# Patient Record
Sex: Male | Born: 1990 | Race: Black or African American | Hispanic: No | Marital: Single | Smoking: Current every day smoker
Health system: Southern US, Community
[De-identification: ages and names within clinical notes are randomized; demographics above are authoritative.]

---

## 2013-08-05 ENCOUNTER — Emergency Department: Payer: Self-pay | Admitting: Emergency Medicine

## 2013-09-02 ENCOUNTER — Emergency Department: Payer: Self-pay | Admitting: Emergency Medicine

## 2014-12-17 ENCOUNTER — Emergency Department
Admit: 2014-12-17 | Disposition: A | Discharge status: Home / Self Care | Payer: Self-pay | Admitting: Emergency Medicine

## 2015-01-11 ENCOUNTER — Emergency Department
Admit: 2015-01-11 | Disposition: A | Discharge status: Home / Self Care | Payer: Self-pay | Admitting: Emergency Medicine

## 2015-04-02 ENCOUNTER — Ambulatory Visit: Payer: Medicaid Other | Admitting: Anesthesiology

## 2015-04-02 ENCOUNTER — Encounter: Payer: Self-pay | Admitting: *Deleted

## 2015-04-02 ENCOUNTER — Ambulatory Visit
Admission: RE | Admit: 2015-04-02 | Discharge: 2015-04-02 | Disposition: A | Discharge status: Home / Self Care | Payer: Medicaid Other | Source: Ambulatory Visit | Attending: Surgery | Admitting: Surgery

## 2015-04-02 ENCOUNTER — Encounter
Admission: RE | Disposition: A | Discharge status: Home / Self Care | Payer: Self-pay | Source: Ambulatory Visit | Attending: Surgery

## 2015-04-02 DIAGNOSIS — S62021K Displaced fracture of middle third of navicular [scaphoid] bone of right wrist, subsequent encounter for fracture with nonunion: Secondary | ICD-10-CM | POA: Diagnosis not present

## 2015-04-02 DIAGNOSIS — W19XXXD Unspecified fall, subsequent encounter: Secondary | ICD-10-CM | POA: Diagnosis not present

## 2015-04-02 DIAGNOSIS — F17200 Nicotine dependence, unspecified, uncomplicated: Secondary | ICD-10-CM | POA: Insufficient documentation

## 2015-04-02 DIAGNOSIS — S62001K Unspecified fracture of navicular [scaphoid] bone of right wrist, subsequent encounter for fracture with nonunion: Secondary | ICD-10-CM | POA: Diagnosis present

## 2015-04-02 HISTORY — PX: ORIF SCAPHOID FRACTURE: SHX2130

## 2015-04-02 SURGERY — OPEN REDUCTION INTERNAL FIXATION (ORIF) SCAPHOID FRACTURE
Anesthesia: General | Laterality: Right | Wound class: Clean

## 2015-04-02 MED ORDER — LACTATED RINGERS IV SOLN
INTRAVENOUS | Status: DC
  Administered 2015-04-02: 13:00:00 via INTRAVENOUS

## 2015-04-02 MED ORDER — OXYCODONE HCL 5 MG PO TABS: 5.0000 mg | ORAL_TABLET | ORAL | Status: DC | PRN

## 2015-04-02 MED ORDER — METOCLOPRAMIDE HCL 5 MG/ML IJ SOLN
5.0000 mg | Freq: Three times a day (TID) | INTRAMUSCULAR | Status: DC | PRN
Start: 2015-04-02 — End: 2015-04-02

## 2015-04-02 MED ORDER — POTASSIUM CHLORIDE IN NACL 20-0.9 MEQ/L-% IV SOLN: INTRAVENOUS | Status: DC

## 2015-04-02 MED ORDER — FENTANYL CITRATE (PF) 100 MCG/2ML IJ SOLN
INTRAMUSCULAR | Status: DC | PRN
  Administered 2015-04-02: 50 ug via INTRAVENOUS
  Administered 2015-04-02 (×3): 25 ug via INTRAVENOUS
  Administered 2015-04-02: 50 ug via INTRAVENOUS
  Administered 2015-04-02: 25 ug via INTRAVENOUS

## 2015-04-02 MED ORDER — OXYCODONE HCL 5 MG PO TABS
5.0000 mg | ORAL_TABLET | ORAL | Status: DC | PRN
  Administered 2015-04-02: 10 mg via ORAL

## 2015-04-02 MED ORDER — LIDOCAINE HCL (CARDIAC) 20 MG/ML IV SOLN
INTRAVENOUS | Status: DC | PRN
  Administered 2015-04-02: 100 mg via INTRAVENOUS

## 2015-04-02 MED ORDER — FENTANYL CITRATE (PF) 100 MCG/2ML IJ SOLN
INTRAMUSCULAR | Status: AC
  Administered 2015-04-02: 25 ug via INTRAVENOUS
  Filled 2015-04-02: qty 2

## 2015-04-02 MED ORDER — CEFAZOLIN SODIUM-DEXTROSE 2-3 GM-% IV SOLR
INTRAVENOUS | Status: AC
  Filled 2015-04-02: qty 50

## 2015-04-02 MED ORDER — BUPIVACAINE HCL (PF) 0.5 % IJ SOLN
INTRAMUSCULAR | Status: DC | PRN
  Administered 2015-04-02: 10 mL

## 2015-04-02 MED ORDER — NEOMYCIN-POLYMYXIN B GU 40-200000 IR SOLN
Status: DC | PRN
  Administered 2015-04-02: 2 mL

## 2015-04-02 MED ORDER — FENTANYL CITRATE (PF) 100 MCG/2ML IJ SOLN
25.0000 ug | INTRAMUSCULAR | Status: AC | PRN
  Administered 2015-04-02 (×6): 25 ug via INTRAVENOUS

## 2015-04-02 MED ORDER — OXYCODONE HCL 5 MG PO TABS
ORAL_TABLET | ORAL | Status: AC
  Administered 2015-04-02: 10 mg via ORAL
  Filled 2015-04-02: qty 2

## 2015-04-02 MED ORDER — ONDANSETRON HCL 4 MG PO TABS: 4.0000 mg | ORAL_TABLET | Freq: Four times a day (QID) | ORAL | Status: DC | PRN

## 2015-04-02 MED ORDER — ONDANSETRON HCL 4 MG/2ML IJ SOLN: 4.0000 mg | Freq: Once | INTRAMUSCULAR | Status: DC | PRN

## 2015-04-02 MED ORDER — BUPIVACAINE HCL (PF) 0.5 % IJ SOLN
INTRAMUSCULAR | Status: AC
  Filled 2015-04-02: qty 30

## 2015-04-02 MED ORDER — CEFAZOLIN SODIUM-DEXTROSE 2-3 GM-% IV SOLR
2.0000 g | Freq: Once | INTRAVENOUS | Status: AC
  Administered 2015-04-02: 2 g via INTRAVENOUS

## 2015-04-02 MED ORDER — ACETAMINOPHEN 10 MG/ML IV SOLN
INTRAVENOUS | Status: DC | PRN
  Administered 2015-04-02: 1000 mg via INTRAVENOUS

## 2015-04-02 MED ORDER — DEXAMETHASONE SODIUM PHOSPHATE 4 MG/ML IJ SOLN
INTRAMUSCULAR | Status: DC | PRN
  Administered 2015-04-02: 10 mg via INTRAVENOUS

## 2015-04-02 MED ORDER — PROPOFOL 10 MG/ML IV BOLUS
INTRAVENOUS | Status: DC | PRN
  Administered 2015-04-02: 200 mg via INTRAVENOUS
  Administered 2015-04-02: 50 mg via INTRAVENOUS

## 2015-04-02 MED ORDER — MIDAZOLAM HCL 2 MG/2ML IJ SOLN
INTRAMUSCULAR | Status: DC | PRN
  Administered 2015-04-02: 2 mg via INTRAVENOUS

## 2015-04-02 MED ORDER — FENTANYL CITRATE (PF) 100 MCG/2ML IJ SOLN
25.0000 ug | INTRAMUSCULAR | Status: AC | PRN
  Administered 2015-04-02 (×2): 25 ug via INTRAVENOUS

## 2015-04-02 MED ORDER — ONDANSETRON HCL 4 MG/2ML IJ SOLN
4.0000 mg | Freq: Four times a day (QID) | INTRAMUSCULAR | Status: DC | PRN
Start: 2015-04-02 — End: 2015-04-02

## 2015-04-02 MED ORDER — NEOMYCIN-POLYMYXIN B GU 40-200000 IR SOLN
Status: AC
  Filled 2015-04-02: qty 2

## 2015-04-02 MED ORDER — ONDANSETRON HCL 4 MG/2ML IJ SOLN
INTRAMUSCULAR | Status: DC | PRN
  Administered 2015-04-02: 4 mg via INTRAVENOUS

## 2015-04-02 MED ORDER — METOCLOPRAMIDE HCL 10 MG PO TABS: 5.0000 mg | ORAL_TABLET | Freq: Three times a day (TID) | ORAL | Status: DC | PRN

## 2015-04-02 MED ORDER — ACETAMINOPHEN 10 MG/ML IV SOLN
INTRAVENOUS | Status: AC
  Filled 2015-04-02: qty 100

## 2015-04-02 SURGICAL SUPPLY — 46 items
BANDAGE ELASTIC 3 CLIP ST LF (GAUZE/BANDAGES/DRESSINGS) ×3 IMPLANT
BANDAGE STRETCH 3X4.1 STRL (GAUZE/BANDAGES/DRESSINGS) IMPLANT
BLADE DEBAKEY 8.0 (BLADE) ×2 IMPLANT
BLADE DEBAKEY 8.0MM (BLADE) ×1
BLADE OSC/SAGITTAL 5.5X25 (BLADE) IMPLANT
BLADE SURG SZ12 CARB STEEL (BLADE) ×6 IMPLANT
BNDG COHESIVE 4X5 TAN STRL (GAUZE/BANDAGES/DRESSINGS) IMPLANT
BNDG ESMARK 4X12 TAN STRL LF (GAUZE/BANDAGES/DRESSINGS) ×3 IMPLANT
BUR RND POLISHING (BURR) ×3 IMPLANT
BUR SURG RND 4.0X8 FLTD (BURR) IMPLANT
CAST PADDING 3X4FT ST 30246 (SOFTGOODS) ×4
CHLORAPREP W/TINT 26ML (MISCELLANEOUS) ×3 IMPLANT
CLOSURE WOUND 1/2 X4 (GAUZE/BANDAGES/DRESSINGS) ×1
DECANTER SPIKE VIAL GLASS SM (MISCELLANEOUS) ×3 IMPLANT
DRAPE FLUOR MINI C-ARM 54X84 (DRAPES) ×3 IMPLANT
DURAPREP 26ML APPLICATOR (WOUND CARE) IMPLANT
FORCEPS JEWEL BIP 4-3/4 STR (INSTRUMENTS) ×3 IMPLANT
GAUZE PETRO XEROFOAM 1X8 (MISCELLANEOUS) ×3 IMPLANT
GLOVE BIO SURGEON STRL SZ8 (GLOVE) ×6 IMPLANT
GLOVE INDICATOR 8.0 STRL GRN (GLOVE) ×3 IMPLANT
GOWN STRL REUS W/ TWL LRG LVL3 (GOWN DISPOSABLE) ×1 IMPLANT
GOWN STRL REUS W/ TWL XL LVL3 (GOWN DISPOSABLE) ×1 IMPLANT
GOWN STRL REUS W/TWL LRG LVL3 (GOWN DISPOSABLE) ×2
GOWN STRL REUS W/TWL XL LVL3 (GOWN DISPOSABLE) ×2
KIT RM TURNOVER STRD PROC AR (KITS) ×3 IMPLANT
NDL KEITH SZ2.5 (NEEDLE) IMPLANT
NDL SAFETY 25GX1.5 (NEEDLE) ×3 IMPLANT
NS IRRIG 500ML POUR BTL (IV SOLUTION) ×3 IMPLANT
PACK EXTREMITY ARMC (MISCELLANEOUS) ×3 IMPLANT
PAD CAST CTTN 3X4 STRL (SOFTGOODS) ×2 IMPLANT
PASSER SUT SWANSON 36MM LOOP (INSTRUMENTS) IMPLANT
SPLINT FAST PLASTER 5X30 (CAST SUPPLIES) ×2
SPLINT PLASTER CAST FAST 5X30 (CAST SUPPLIES) ×1 IMPLANT
SPLINT WRIST M LT TX990308 (SOFTGOODS) IMPLANT
STOCKINETTE IMPERVIOUS 9X36 MD (GAUZE/BANDAGES/DRESSINGS) IMPLANT
STOCKINETTE STRL 4IN 9604848 (GAUZE/BANDAGES/DRESSINGS) ×3 IMPLANT
STRIP CLOSURE SKIN 1/2X4 (GAUZE/BANDAGES/DRESSINGS) ×2 IMPLANT
SUT ETHIBOND 0 MO6 C/R (SUTURE) IMPLANT
SUT PROLENE 4 0 PS 2 18 (SUTURE) ×3 IMPLANT
SUT VIC AB 2-0 CT2 27 (SUTURE) IMPLANT
SUT VIC AB 2-0 SH 27 (SUTURE) ×4
SUT VIC AB 2-0 SH 27XBRD (SUTURE) ×2 IMPLANT
SUT VIC AB 3-0 SH 27 (SUTURE)
SUT VIC AB 3-0 SH 27X BRD (SUTURE) IMPLANT
SYRINGE 10CC LL (SYRINGE) ×3 IMPLANT
WIRE Z .062 C-WIRE SPADE TIP (WIRE) IMPLANT

## 2015-04-02 NOTE — H&P (Signed)
Paper H&P to be scanned into permanent record. H&P reviewed. No changes. 

## 2015-04-02 NOTE — Anesthesia Postprocedure Evaluation (Signed)
  Anesthesia Post-op Note  Patient: Jack Ashley  Procedure(s) Performed: Procedure(s): OPEN REDUCTION INTERNAL FIXATION (ORIF) SCAPHOID FRACTURE (Right)  Anesthesia type:General  Patient location: PACU  Post pain: Pain level controlled  Post assessment: Post-op Vital signs reviewed, Patient's Cardiovascular Status Stable, Respiratory Function Stable, Patent Airway and No signs of Nausea or vomiting  Post vital signs: Reviewed and stable  Last Vitals:  Filed Vitals:   04/02/15 1629  BP: 135/66  Pulse: 84  Temp: 36.7 C  Resp: 18    Level of consciousness: awake, alert  and patient cooperative  Complications: No apparent anesthesia complications

## 2015-04-02 NOTE — Discharge Instructions (Addendum)
Keep splint dry and intact. °Keep hand elevated above heart level. °Apply ice to affected area frequently. °Return for follow-up in 10-14 days or as scheduled. ° °AMBULATORY SURGERY  °DISCHARGE INSTRUCTIONS ° ° °1) The drugs that you were given will stay in your system until tomorrow so for the next 24 hours you should not: ° °A) Drive an automobile °B) Make any legal decisions °C) Drink any alcoholic beverage ° ° °2) You may resume regular meals tomorrow.  Today it is better to start with liquids and gradually work up to solid foods. ° °You may eat anything you prefer, but it is better to start with liquids, then soup and crackers, and gradually work up to solid foods. ° ° °3) Please notify your doctor immediately if you have any unusual bleeding, trouble breathing, redness and pain at the surgery site, drainage, fever, or pain not relieved by medication. ° ° ° °4) Additional Instructions: ° ° ° ° ° ° ° °Please contact your physician with any problems or Same Day Surgery at [phone], Monday through Friday 6 am to 4 pm, or [place] [facility] number at [phone]. °

## 2015-04-02 NOTE — Transfer of Care (Signed)
Immediate Anesthesia Transfer of Care Note  Patient: Jack Ashley  Procedure(s) Performed: Procedure(s): OPEN REDUCTION INTERNAL FIXATION (ORIF) SCAPHOID FRACTURE (Right)  Patient Location: PACU  Anesthesia Type:General  Level of Consciousness: awake, alert  and oriented  Airway & Oxygen Therapy: Patient Spontanous Breathing and Patient connected to face mask oxygen  Post-op Assessment: Report given to RN and Post -op Vital signs reviewed and stable  Post vital signs: Reviewed and stable  Last Vitals:  Filed Vitals:   04/02/15 1629  BP: 135/66  Pulse: 84  Temp: 36.7 C  Resp: 18    Complications: No apparent anesthesia complications

## 2015-04-02 NOTE — Anesthesia Procedure Notes (Signed)
Procedure Name: LMA Insertion Date/Time: 04/02/2015 2:03 PM Performed by: Irving BurtonBACHICH, Mazi Brailsford Pre-anesthesia Checklist: Patient identified, Emergency Drugs available, Suction available and Patient being monitored Patient Re-evaluated:Patient Re-evaluated prior to inductionOxygen Delivery Method: Circle system utilized Preoxygenation: Pre-oxygenation with 100% oxygen Intubation Type: IV induction Ventilation: Mask ventilation without difficulty LMA: LMA inserted LMA Size: 4.5 Number of attempts: 1 Airway Equipment and Method: Patient positioned with wedge pillow Placement Confirmation: positive ETCO2 and breath sounds checked- equal and bilateral Tube secured with: Tape Dental Injury: Teeth and Oropharynx as per pre-operative assessment

## 2015-04-02 NOTE — Anesthesia Preprocedure Evaluation (Signed)
Anesthesia Evaluation  Patient identified by MRN, date of birth, ID band Patient awake    Reviewed: Allergy & Precautions, NPO status , Patient's Chart, lab work & pertinent test results  History of Anesthesia Complications Negative for: history of anesthetic complications  Airway Mallampati: II       Dental  (+) Teeth Intact   Pulmonary Current Smoker,    + decreased breath sounds      Cardiovascular negative cardio ROS      Neuro/Psych negative neurological ROS  negative psych ROS   GI/Hepatic negative GI ROS, Neg liver ROS,   Endo/Other  negative endocrine ROS  Renal/GU negative Renal ROS  negative genitourinary   Musculoskeletal negative musculoskeletal ROS (+)   Abdominal Normal abdominal exam  (+)   Peds negative pediatric ROS (+)  Hematology negative hematology ROS (+)   Anesthesia Other Findings   Reproductive/Obstetrics negative OB ROS                             Anesthesia Physical Anesthesia Plan  ASA: II  Anesthesia Plan: General   Post-op Pain Management:    Induction: Intravenous  Airway Management Planned: LMA  Additional Equipment:   Intra-op Plan:   Post-operative Plan: Extubation in OR  Informed Consent: I have reviewed the patients History and Physical, chart, labs and discussed the procedure including the risks, benefits and alternatives for the proposed anesthesia with the patient or authorized representative who has indicated his/her understanding and acceptance.     Plan Discussed with: CRNA  Anesthesia Plan Comments:         Anesthesia Quick Evaluation

## 2015-04-02 NOTE — Op Note (Signed)
04/02/2015  4:20 PM  Patient:   Jack Ashley  Pre-Op Diagnosis:   Nonunion right scaphoid fracture.  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  Surgeon:   Maryagnes Amos, MD  Assistant:   None  Anesthesia:   General LMA  Findings:   As above.  Complications:   None  EBL:   10 cc  Fluids:   600 cc crystalloid  TT:   90 minutes at 250 mmHg  Drains:   None  Closure:   4-0 proline interrupted sutures  Implants:   None  Brief Clinical Note:   The patient is a 24 year old male who sustained the above-noted injury 4 months ago when he slipped on a hillside and fell backwards onto his outstretched right hand. Initial x-rays demonstrated an essentially nondisplaced transverse fracture through the waist of the right scaphoid. He was placed in a thumb spica splint which later was converted to a thumb spica cast. Most recent x-rays several weeks ago showed a persistent lucency at the fracture site, consistent with a nonunion. He presents at this time for takedown of the nonunion and Russe bone grafting of the fracture.  Procedure:   The patient was brought into the operating room and lain in the supine position. After adequate general laryngal mask anesthesia was obtained, the patient's right hand and upper extremity were prepped with DuraPrep solution before being draped sterilely. Preoperative antibiotics were administered. After verifying the appropriate side with a timeout, the limb was exsanguinated with an Esmarch and the tourniquet was inflated to 250 mmHg. A longitudinal incision was made along the radial margin of the flexor carpi radialis and extended distally to the flexor crease, then angled slightly radially for another centimeter. The incision was carried down through the subcutaneous tissues to expose the volar forearm fascia. This was incised length of the incision. The radial margin of the flexor carpi radialis was identified and  the flexor sheath incised at this point. The floor of the flexor sheath also was incised along the radial margin and dissection carried deeper. The volar radiocarpal capsule was identified and incised longitudinally to expose the radius scaphoid joint. The fracture nonunion site was readily identified. After optimizing exposure, the fracture nonunion site was debrided using narrow rongeurs and small curettes. Care was taken to remove all the dead bone in order to identify bleeding bone proximally and distally. A 2 mm bur was used to create a trough in both the proximal and distal fragments to accommodate the bone graft.  The distal portion of the pronator quadratus was released from its radial margin and reflected ulnarly to expose the volar aspect of the distal radius. An approximately 1.2 x 1.4 cm window was created in the volar cortex which was then split longitudinally. Each piece was carefully contoured before being inserted into the trough created in the scaphoid. Additional cancellus bone was harvested from the distal radius with curettes and packed in and around the cortico-cancellus graft to fill in the gaps. Ortho-Scan assessment in AP and lateral projections demonstrated excellent reduction of the scaphoid fracture with excellent restoration of the scaphoid length.  The pronator quadratus was reapproximated to the small amount of tissue left radially using 2-0 Vicryl interrupted sutures. The volar capsule also was repaired using 2-0 Vicryl interrupted sutures before the flexor carpi radialis tendon sheath was reapproximated using 2-0 Vicryl interrupted sutures. The subcutaneous tissues were reapproximated using 2-0 Vicryl interrupted sutures before the skin  was closed using 4-0 proline interrupted sutures. A total of 10 cc of 0.5% plain Sensorcaine was injected in and around the incision site to help with postoperative analgesia before a sterile bulky dressing was applied to the wrist. The patient was  placed into a volar thumb spica splint with the wrist in neutral position and the thumb in slight abduction. The patient was then awakened, extubated, and returned to the recovery room in satisfactory condition after tolerating the procedure well.

## 2015-04-03 ENCOUNTER — Encounter: Payer: Self-pay | Admitting: Surgery

## 2015-08-19 ENCOUNTER — Other Ambulatory Visit: Payer: Self-pay | Admitting: Surgery

## 2015-08-19 DIAGNOSIS — S62024G Nondisplaced fracture of middle third of navicular [scaphoid] bone of right wrist, subsequent encounter for fracture with delayed healing: Secondary | ICD-10-CM

## 2015-08-26 ENCOUNTER — Ambulatory Visit: Payer: MEDICAID | Attending: Surgery
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
## 2016-03-09 ENCOUNTER — Encounter: Payer: Self-pay | Admitting: Emergency Medicine

## 2016-03-09 ENCOUNTER — Emergency Department
Admission: EM | Admit: 2016-03-09 | Discharge: 2016-03-09 | Disposition: A | Discharge status: Home / Self Care | Payer: MEDICAID | Attending: Emergency Medicine | Admitting: Emergency Medicine

## 2016-03-09 DIAGNOSIS — L089 Local infection of the skin and subcutaneous tissue, unspecified: Secondary | ICD-10-CM | POA: Insufficient documentation

## 2016-03-09 DIAGNOSIS — F17210 Nicotine dependence, cigarettes, uncomplicated: Secondary | ICD-10-CM | POA: Insufficient documentation

## 2016-03-09 DIAGNOSIS — K130 Diseases of lips: Secondary | ICD-10-CM

## 2016-03-09 MED ORDER — TRAMADOL HCL 50 MG PO TABS: 50.0000 mg | ORAL_TABLET | Freq: Four times a day (QID) | ORAL | Status: AC | PRN

## 2016-03-09 MED ORDER — SULFAMETHOXAZOLE-TRIMETHOPRIM 800-160 MG PO TABS: 1.0000 | ORAL_TABLET | Freq: Two times a day (BID) | ORAL | Status: DC

## 2016-03-09 NOTE — ED Notes (Signed)
Pt c/o tenderness/swelling of top lip beginning Wednesday. Swelling/tenderness has gotten progressively worse since onset. Pt denies injury, itching, SOB, N/V. Pt reports he has been taking benadryl with no relief

## 2016-03-09 NOTE — ED Notes (Signed)
Pt. Has swelling to upper lip.  Pt. Denies trouble swallowing or breathing.

## 2016-03-09 NOTE — Discharge Instructions (Signed)

## 2016-03-09 NOTE — ED Provider Notes (Signed)
[facility] Emergency Department Provider Note  ____________________________________________  Time seen: On arrival  I have reviewed the triage vital signs and the nursing notes.   HISTORY  Chief Complaint Oral Swelling    HPI Jack Ashley is a 25 y.o. male who presents with upper lip swelling for 4-5 days. He is taking Benadryl without improvement. He reports it is painful. He denies fevers or chills. No intraoral swelling. He has never had this before    History reviewed. No pertinent past medical history.  There are no active problems to display for this patient.   Past Surgical History  Procedure Laterality Date  . Orif scaphoid fracture Right 04/02/2015    Procedure: OPEN REDUCTION INTERNAL FIXATION (ORIF) SCAPHOID FRACTURE;  Surgeon: Christena FlakeJohn J Poggi, MD;  Location: [facility] ORS;  Service: Orthopedics;  Laterality: Right;    Current Outpatient Rx  Name  Route  Sig  Dispense  Refill  . oxyCODONE (ROXICODONE) 5 MG immediate release tablet   Oral   Take 1-2 tablets (5-10 mg total) by mouth every 4 (four) hours as needed for severe pain.   50 tablet   0   . sulfamethoxazole-trimethoprim (BACTRIM DS,SEPTRA DS) 800-160 MG tablet   Oral   Take 1 tablet by mouth 2 (two) times daily.   14 tablet   0   . traMADol (ULTRAM) 50 MG tablet   Oral   Take 1 tablet (50 mg total) by mouth every 6 (six) hours as needed.   20 tablet   0     Allergies Peanuts  No family history on file.  Social History Social History  Substance Use Topics  . Smoking status: Current Every Day Smoker -- 0.50 packs/day for 5 years    Types: Cigarettes  . Smokeless tobacco: None  . Alcohol Use: Yes    Review of Systems  Constitutional: Negative for fever.  ENT: Negative for sore throat     Skin: Negative for rash.    ____________________________________________   PHYSICAL EXAM:  VITAL SIGNS: ED Triage Vitals  Enc Vitals Group     BP 03/09/16 0541  138/72 mmHg     Pulse Rate 03/09/16 0541 58     Resp 03/09/16 0541 18     Temp 03/09/16 0541 97.5 F (36.4 C)     Temp Source 03/09/16 0541 Oral     SpO2 03/09/16 0541 100 %     Weight 03/09/16 0541 190 lb (86.183 kg)     Height 03/09/16 0541 5\' 10"  (1.778 m)     Head Cir --      Peak Flow --      Pain Score 03/09/16 0543 10     Pain Loc --      Pain Edu? --      Excl. in GC? --      Constitutional: Alert and oriented. Well appearing and in no distress. Eyes: Conjunctivae are normal.  ENT   Head: Normocephalic and atraumatic.   Mouth/Throat: Mucous membranes are moist. Area of swelling center right upper lip, no focal area of fluctuance, mildly tender, suspect early abscess  Respiratory: Normal respiratory effort without tachypnea nor retractions.    Neurologic:  Normal speech and language.  Skin:  Skin is warm, dry and intact. No rash noted. Psychiatric: Mood and affect are normal. Patient exhibits appropriate insight and judgment.  ____________________________________________    LABS (pertinent positives/negatives)  Labs Reviewed - No data to display  ____________________________________________     ____________________________________________  RADIOLOGY I have personally reviewed any xrays that were ordered on this patient: None  ____________________________________________   PROCEDURES  Procedure(s) performed: none   ____________________________________________   INITIAL IMPRESSION / ASSESSMENT AND PLAN / ED COURSE  Pertinent labs & imaging results that were available during my care of the patient were reviewed by me and considered in my medical decision making (see chart for details).  Patient with likely early upper lip abscess, we will try warm compresses and antibiotics and refer the patient to ENT if no improvement  ____________________________________________   FINAL CLINICAL IMPRESSION(S) / ED DIAGNOSES  Final diagnoses:   Infection of lip     Jene Everyobert Christopher Hink, MD 03/09/16 0730

## 2017-06-13 ENCOUNTER — Emergency Department
Admission: EM | Admit: 2017-06-13 | Discharge: 2017-06-14 | Payer: Self-pay | Attending: Emergency Medicine | Admitting: Emergency Medicine

## 2017-06-13 DIAGNOSIS — Z91010 Allergy to peanuts: Secondary | ICD-10-CM | POA: Insufficient documentation

## 2017-06-13 DIAGNOSIS — F10229 Alcohol dependence with intoxication, unspecified: Secondary | ICD-10-CM | POA: Insufficient documentation

## 2017-06-13 DIAGNOSIS — F10920 Alcohol use, unspecified with intoxication, uncomplicated: Secondary | ICD-10-CM

## 2017-06-13 DIAGNOSIS — R451 Restlessness and agitation: Secondary | ICD-10-CM | POA: Insufficient documentation

## 2017-06-13 DIAGNOSIS — R4689 Other symptoms and signs involving appearance and behavior: Secondary | ICD-10-CM

## 2017-06-13 DIAGNOSIS — R51 Headache: Secondary | ICD-10-CM | POA: Insufficient documentation

## 2017-06-13 DIAGNOSIS — F17210 Nicotine dependence, cigarettes, uncomplicated: Secondary | ICD-10-CM | POA: Insufficient documentation

## 2017-06-13 LAB — CBC WITH DIFFERENTIAL/PLATELET
Basophils Absolute: 0 10*3/uL (ref 0–0.1)
Basophils Relative: 1 %
EOS ABS: 0.1 10*3/uL (ref 0–0.7)
EOS PCT: 2 %
HCT: 43.4 % (ref 40.0–52.0)
Hemoglobin: 14 g/dL (ref 13.0–18.0)
LYMPHS ABS: 2.5 10*3/uL (ref 1.0–3.6)
Lymphocytes Relative: 34 %
MCH: 24.9 pg — ABNORMAL LOW (ref 26.0–34.0)
MCHC: 32.2 g/dL (ref 32.0–36.0)
MCV: 77.4 fL — ABNORMAL LOW (ref 80.0–100.0)
MONOS PCT: 3 %
Monocytes Absolute: 0.2 10*3/uL (ref 0.2–1.0)
Neutro Abs: 4.4 10*3/uL (ref 1.4–6.5)
Neutrophils Relative %: 60 %
PLATELETS: 342 10*3/uL (ref 150–440)
RBC: 5.61 MIL/uL (ref 4.40–5.90)
RDW: 13.6 % (ref 11.5–14.5)
WBC: 7.2 10*3/uL (ref 3.8–10.6)

## 2017-06-13 LAB — BASIC METABOLIC PANEL
Anion gap: 10 (ref 5–15)
BUN: 10 mg/dL (ref 6–20)
CO2: 23 mmol/L (ref 22–32)
CREATININE: 1.13 mg/dL (ref 0.61–1.24)
Calcium: 9.2 mg/dL (ref 8.9–10.3)
Chloride: 110 mmol/L (ref 101–111)
GFR calc Af Amer: >60 mL/min (ref >60)
GFR calc non Af Amer: >60 mL/min (ref >60)
GLUCOSE: 93 mg/dL (ref 65–99)
Potassium: 3.5 mmol/L (ref 3.5–5.1)
Sodium: 143 mmol/L (ref 135–145)

## 2017-06-13 LAB — URINE DRUG SCREEN, QUALITATIVE (ARMC ONLY)
Amphetamines, Ur Screen: NOT DETECTED
BARBITURATES, UR SCREEN: NOT DETECTED
Benzodiazepine, Ur Scrn: NOT DETECTED
CANNABINOID 50 NG, UR ~~LOC~~: NOT DETECTED
COCAINE METABOLITE, UR ~~LOC~~: NOT DETECTED
MDMA (Ecstasy)Ur Screen: NOT DETECTED
Methadone Scn, Ur: NOT DETECTED
Opiate, Ur Screen: NOT DETECTED
Phencyclidine (PCP) Ur S: NOT DETECTED
Tricyclic, Ur Screen: NOT DETECTED

## 2017-06-13 LAB — ETHANOL: Alcohol, Ethyl (B): 256 mg/dL — ABNORMAL HIGH (ref <10)

## 2017-06-13 MED ORDER — HALOPERIDOL LACTATE 5 MG/ML IJ SOLN
5.0000 mg | Freq: Once | INTRAMUSCULAR | Status: AC
  Administered 2017-06-13: 5 mg via INTRAMUSCULAR

## 2017-06-13 MED ORDER — LORAZEPAM 2 MG/ML IJ SOLN
2.0000 mg | Freq: Once | INTRAMUSCULAR | Status: AC
  Administered 2017-06-13: 2 mg via INTRAMUSCULAR

## 2017-06-13 NOTE — ED Triage Notes (Signed)
Pt arrives in [facility] Sherriff custody due to being combative at home. Pt reports alcohol consumption tonight. Pt arrived in hand cuffs with police at bedside. Pt states he was tazed. Pt yelling at officers at this time not wanting to cooperate or answer questions. EDP at bedside.
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
## 2017-06-14 ENCOUNTER — Emergency Department: Payer: Self-pay

## 2017-06-14 NOTE — Discharge Instructions (Signed)
You were seen in the emergency department for alcohol intoxication.  Please seek help from the recommended resources for assistance with your alcohol dependence.  If you have any thoughts of hurting herself or others, please call 911 or return to the emergency department.  Please avoid drug and alcohol use.  Never drive a vehicle or operate machinery while intoxicated.  
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
## 2017-06-14 NOTE — ED Notes (Signed)
Pt transferred to CT with officer

## 2017-06-14 NOTE — ED Provider Notes (Signed)
Clinical Course as of Jun 14 612  [facility] Jun 14, 2017  0422 patient is awake, alert, coherent, and ambulatory. No further threats nor reports of SI/HI.  The sheriff's deputy is taking him to jail.  [CF]    Clinical Course User Index [CF] Loleta Rose, MD    Final diagnoses:  Alcoholic intoxication without complication [facility])  Aggressive behavior      Loleta Rose, MD 06/14/17 [phone]

## 2017-06-14 NOTE — ED Notes (Signed)
Pt sleeping at this time on left side. Equal rise and fall of chest noted. Pt in NAD at this time. Sherriff with pt.
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
## 2023-01-19 ENCOUNTER — Emergency Department: Payer: No Typology Code available for payment source

## 2023-01-19 ENCOUNTER — Encounter: Payer: Self-pay | Admitting: Emergency Medicine

## 2023-01-19 ENCOUNTER — Emergency Department
Admission: EM | Admit: 2023-01-19 | Discharge: 2023-01-19 | Disposition: A | Discharge status: Home / Self Care | Payer: No Typology Code available for payment source | Attending: Emergency Medicine | Admitting: Emergency Medicine

## 2023-01-19 ENCOUNTER — Other Ambulatory Visit: Payer: Self-pay

## 2023-01-19 DIAGNOSIS — M542 Cervicalgia: Secondary | ICD-10-CM | POA: Insufficient documentation

## 2023-01-19 DIAGNOSIS — R519 Headache, unspecified: Secondary | ICD-10-CM | POA: Insufficient documentation

## 2023-01-19 DIAGNOSIS — Y92410 Unspecified street and highway as the place of occurrence of the external cause: Secondary | ICD-10-CM | POA: Diagnosis not present

## 2023-01-19 MED ORDER — MELOXICAM 15 MG PO TABS: 15.0000 mg | ORAL_TABLET | Freq: Every day | ORAL | 1 refills | Status: AC

## 2023-01-19 MED ORDER — METHOCARBAMOL 500 MG PO TABS: 500.0000 mg | ORAL_TABLET | Freq: Three times a day (TID) | ORAL | 0 refills | Status: AC | PRN
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
## 2023-01-19 NOTE — Discharge Instructions (Signed)
Take Meloxicam once daily for pain and inflammation. Take Robaxin at night before bed.  
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
